# Patient Record
Sex: Female | Born: 2005 | Race: Black or African American | Hispanic: No | Marital: Single | Smoking: Never smoker
Health system: Southern US, Community
[De-identification: ages and names within clinical notes are randomized; demographics above are authoritative.]

## PROBLEM LIST (undated history)

## (undated) DIAGNOSIS — F319 Bipolar disorder, unspecified: Secondary | ICD-10-CM

## (undated) DIAGNOSIS — L309 Dermatitis, unspecified: Secondary | ICD-10-CM

## (undated) DIAGNOSIS — F909 Attention-deficit hyperactivity disorder, unspecified type: Secondary | ICD-10-CM

## (undated) DIAGNOSIS — F32A Depression, unspecified: Secondary | ICD-10-CM

## (undated) DIAGNOSIS — F329 Major depressive disorder, single episode, unspecified: Secondary | ICD-10-CM

## (undated) DIAGNOSIS — K219 Gastro-esophageal reflux disease without esophagitis: Secondary | ICD-10-CM

## (undated) HISTORY — DX: Depression, unspecified: F32.A

## (undated) HISTORY — DX: Dermatitis, unspecified: L30.9

## (undated) HISTORY — DX: Attention-deficit hyperactivity disorder, unspecified type: F90.9

## (undated) HISTORY — DX: Gastro-esophageal reflux disease without esophagitis: K21.9

## (undated) HISTORY — DX: Bipolar disorder, unspecified: F31.9

---
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
## 2023-06-03 ENCOUNTER — Ambulatory Visit: Payer: Self-pay | Admitting: Nurse Practitioner

## 2023-06-07 ENCOUNTER — Encounter: Payer: Self-pay | Admitting: Nurse Practitioner

## 2023-06-07 ENCOUNTER — Ambulatory Visit: Payer: MEDICAID | Admitting: Nurse Practitioner

## 2023-06-07 VITALS — BP 126/86 | HR 113 | Temp 97.5°F | Resp 18 | Ht 63.03 in | Wt 226.0 lb

## 2023-06-07 DIAGNOSIS — F320 Major depressive disorder, single episode, mild: Secondary | ICD-10-CM

## 2023-06-07 DIAGNOSIS — L906 Striae atrophicae: Secondary | ICD-10-CM

## 2023-06-07 NOTE — Assessment & Plan Note (Signed)
 PHQ9 positive,  declines medication, reports doing well without.

## 2023-06-07 NOTE — Progress Notes (Signed)
 BP 126/86   Pulse (!) 113   Temp (!) 97.5 F (36.4 C)   Resp 18   Ht 5' 3.03" (1.601 m)   Wt 226 lb (102.5 kg)   SpO2 92%   BMI 40.00 kg/m    Subjective:    Patient ID: Brandy Rich, female    DOB: [birth year], 18 y.o.   MRN: [id number]  HPI: Brandy Rich is a 18 y.o. female  Chief Complaint  Patient presents with   Eczema    Under breast    HPI Patient presents to clinic with complaints of bilateral scars under neath breasts. Patient reports that scars appeared a few months ago and has been applying eczema cream to areas, and has not seen any improvement. Patient denies any pain, burning, or itching.  Depression: PHQ-9 screen was positive during today's visit. Discussed options for counseling, psychiatry, and medication. Patient declined all options      06/07/2023    2:34 PM 09/16/2022    1:24 PM 07/31/2019    3:20 PM  Depression screen PHQ 2/9  Decreased Interest 1 0 0  Down, Depressed, Hopeless 0 0 1  PHQ - 2 Score 1 0 1  Altered sleeping 2 0 2  Tired, decreased energy 2 0 1  Change in appetite 1 0 2  Feeling bad or failure about yourself  0 0 2  Trouble concentrating 0 0 1  Moving slowly or fidgety/restless 0 0 2  Suicidal thoughts 0 0 0  PHQ-9 Score 6 0 11  Difficult doing work/chores Not difficult at all Not difficult at all Very difficult    Relevant past medical, surgical, family and social history reviewed and updated as indicated. Interim medical history since our last visit reviewed. Allergies and medications reviewed and updated.  Review of Systems Ten systems reviewed and is negative except as mentioned in HPI  Per HPI unless specifically indicated above     Objective:    BP 126/86   Pulse (!) 113   Temp (!) 97.5 F (36.4 C)   Resp 18   Ht 5' 3.03" (1.601 m)   Wt 226 lb (102.5 kg)   SpO2 92%   BMI 40.00 kg/m    Wt Readings from Last 3 Encounters:  06/07/23 226 lb (102.5 kg) (99%, Z= 2.27)*  09/16/22 211 lb 4.8 oz (95.8 kg)  (98%, Z= 2.11)*  07/31/19 196 lb 8 oz (89.1 kg) (98%, Z= 2.10)*   * Growth percentiles are based on CDC (Girls, 2-20 Years) data.    Physical Exam Vitals reviewed.  Constitutional:      Appearance: Normal appearance.  HENT:     Head: Normocephalic.  Cardiovascular:     Rate and Rhythm: Normal rate.  Pulmonary:     Effort: Pulmonary effort is normal.  Chest:  Breasts:    Right: Skin change present.     Left: Skin change present.       Comments: Patient has bilateral stretch marks underneath breasts, denies pain, itching or burning Neurological:     General: No focal deficit present.     Mental Status: She is alert and oriented to person, place, and time. Mental status is at baseline.  Psychiatric:        Mood and Affect: Mood normal.        Behavior: Behavior normal.        Thought Content: Thought content normal.        Judgment: Judgment normal.     Results for  orders placed or performed in visit on 09/16/22  CBC with Differential/Platelet   Collection Time: 09/16/22  1:59 PM  Result Value Ref Range   WBC 6.6 4.5 - 13.0 Thousand/uL   RBC 5.09 3.80 - 5.10 Million/uL   Hemoglobin 13.7 11.5 - 15.3 g/dL   HCT 53.6 64.4 - 03.4 %   MCV 82.1 78.0 - 98.0 fL   MCH 26.9 25.0 - 35.0 pg   MCHC 32.8 31.0 - 36.0 g/dL   RDW 74.2 59.5 - 63.8 %   Platelets 375 140 - 400 Thousand/uL   MPV 9.3 7.5 - 12.5 fL   Neutro Abs 3,828 1,800 - 8,000 cells/uL   Lymphs Abs 2,086 1,200 - 5,200 cells/uL   Absolute Monocytes 495 200 - 900 cells/uL   Eosinophils Absolute 172 15 - 500 cells/uL   Basophils Absolute 20 0 - 200 cells/uL   Neutrophils Relative % 58 %   Total Lymphocyte 31.6 %   Monocytes Relative 7.5 %   Eosinophils Relative 2.6 %   Basophils Relative 0.3 %  COMPLETE METABOLIC PANEL WITH GFR   Collection Time: 09/16/22  1:59 PM  Result Value Ref Range   Glucose, Bld 95 65 - 99 mg/dL   BUN 9 7 - 20 mg/dL   Creat 7.56 4.33 - 2.95 mg/dL   eGFR 188 > OR = 60 CZ/YSA/6.30Z6    BUN/Creatinine Ratio SEE NOTE: 6 - 22 (calc)   Sodium 140 135 - 146 mmol/L   Potassium 4.3 3.8 - 5.1 mmol/L   Chloride 106 98 - 110 mmol/L   CO2 25 20 - 32 mmol/L   Calcium 9.3 8.9 - 10.4 mg/dL   Total Protein 6.7 6.3 - 8.2 g/dL   Albumin 4.1 3.6 - 5.1 g/dL   Globulin 2.6 2.0 - 3.8 g/dL (calc)   AG Ratio 1.6 1.0 - 2.5 (calc)   Total Bilirubin 0.6 0.2 - 1.1 mg/dL   Alkaline phosphatase (APISO) 61 36 - 128 U/L   AST 10 (L) 12 - 32 U/L   ALT 7 5 - 32 U/L  Lipid panel   Collection Time: 09/16/22  1:59 PM  Result Value Ref Range   Cholesterol 183 (H) <170 mg/dL   HDL 39 (L) >01 mg/dL   Triglycerides 093 (H) <90 mg/dL   LDL Cholesterol (Calc) 118 (H) <110 mg/dL (calc)   Total CHOL/HDL Ratio 4.7 <5.0 (calc)   Non-HDL Cholesterol (Calc) 144 (H) <120 mg/dL (calc)  Hemoglobin A3F   Collection Time: 09/16/22  1:59 PM  Result Value Ref Range   Hgb A1c MFr Bld 5.7 (H) <5.7 % of total Hgb   Mean Plasma Glucose 117 mg/dL   eAG (mmol/L) 6.5 mmol/L  Hepatitis C antibody   Collection Time: 09/16/22  1:59 PM  Result Value Ref Range   Hepatitis C Ab NON-REACTIVE NON-REACTIVE  HIV Antibody (routine testing w rflx)   Collection Time: 09/16/22  1:59 PM  Result Value Ref Range   HIV 1&2 Ab, 4th Generation NON-REACTIVE NON-REACTIVE  TSH   Collection Time: 09/16/22  1:59 PM  Result Value Ref Range   TSH 0.90 mIU/L  FSH/LH   Collection Time: 09/16/22  1:59 PM  Result Value Ref Range   FSH 8.1 mIU/mL   LH 19.3 mIU/mL  Prolactin   Collection Time: 09/16/22  1:59 PM  Result Value Ref Range   Prolactin 8.9 ng/mL       Assessment & Plan:   Problem List Items Addressed This Visit  Other   Major depressive disorder   PHQ9 positive,  declines medication, reports doing well without.       Other Visit Diagnoses       Striae distensae    -  Primary   Patient has bilateral stretch marks underneath breast. Patient advised to keep areas dry and clean. Patient also advised to apply  Vitamin E to areas of concern       Patient has bilateral stretch marks underneath breast. Patient advised to keep areas dry and clean. Patient also advised to apply Vitamin E to areas of concern to minimize scaring.     Follow up plan: Return if symptoms worsen or fail to improve.

## 2023-09-17 ENCOUNTER — Encounter: Payer: Medicaid Other | Admitting: Nurse Practitioner

## 2023-09-30 ENCOUNTER — Encounter: Payer: MEDICAID | Admitting: Nurse Practitioner

## 2023-10-12 ENCOUNTER — Encounter: Payer: Self-pay | Admitting: Nurse Practitioner

## 2023-10-12 ENCOUNTER — Ambulatory Visit (INDEPENDENT_AMBULATORY_CARE_PROVIDER_SITE_OTHER): Payer: MEDICAID | Admitting: Nurse Practitioner

## 2023-10-12 VITALS — BP 124/82 | HR 93 | Temp 98.5°F | Resp 18 | Ht 63.0 in | Wt 223.9 lb

## 2023-10-12 DIAGNOSIS — R7303 Prediabetes: Secondary | ICD-10-CM | POA: Diagnosis not present

## 2023-10-12 DIAGNOSIS — E782 Mixed hyperlipidemia: Secondary | ICD-10-CM | POA: Diagnosis not present

## 2023-10-12 DIAGNOSIS — Z130 Encounter for screening for diseases of the blood and blood-forming organs and certain disorders involving the immune mechanism: Secondary | ICD-10-CM

## 2023-10-12 DIAGNOSIS — Z6839 Body mass index (BMI) 39.0-39.9, adult: Secondary | ICD-10-CM

## 2023-10-12 DIAGNOSIS — E66812 Obesity, class 2: Secondary | ICD-10-CM

## 2023-10-12 DIAGNOSIS — Z0000 Encounter for general adult medical examination without abnormal findings: Secondary | ICD-10-CM

## 2023-10-12 DIAGNOSIS — N912 Amenorrhea, unspecified: Secondary | ICD-10-CM | POA: Diagnosis not present

## 2023-10-12 NOTE — Progress Notes (Signed)
 Name: Brandy Rich   MRN: [id number]    DOB: [birth year]   Date:10/12/2023       Progress Note  Subjective  Chief Complaint  Chief Complaint  Patient presents with   Annual Exam   Amenorrhea    For about 1 year    HPI  Discussed the use of AI scribe software for clinical note transcription with the patient, who gave verbal consent to proceed.  History of Present Illness Brandy Rich is a 18 year old female who presents with amenorrhea for an annual physical exam.  She has not had a menstrual period for the past year. Previously, her periods were irregular, but this is the first time she has gone a full year without menstruation. She occasionally experiences random cramping without the onset of a period. Menarche occurred at age 83 with initially regular cycles. She has never seen a gynecologist before.  She has a history of prediabetes and hyperlipidemia, with no current symptoms related to these conditions. She has a history of obesity and is attempting to incorporate exercise into her routine, although she finds it challenging due to the heat. She sometimes tries to do sit-ups and is working on making exercise a habit.  She reports getting 7 to 8 hours of sleep each night. She is not currently sexually active. No issues with incontinence or problems going to the bathroom.  Family history is significant for adenomyosis, as her mother had a hysterectomy due to this condition.   Patient presents for annual CPE.  Diet: eats whatever she wants Exercise: just dance, walks in place  Sleep: 7-8 hours Last dental exam:last year Last eye exam: been awhile, will schedule  Flowsheet Row Office Visit from 10/12/2023 in [facility]  AUDIT-C Score 0   Depression: Phq 9 is  negative    10/12/2023   10:00 AM 06/07/2023    2:34 PM 09/16/2022    1:24 PM 07/31/2019    3:20 PM 06/28/2019   10:49 AM  Depression screen PHQ 2/9  Decreased Interest 0 1 0 0 0  Down,  Depressed, Hopeless 0 0 0 1 1  PHQ - 2 Score 0 1 0 1 1  Altered sleeping 0 2 0 2   Tired, decreased energy 0 2 0 1 3  Change in appetite 0 1 0 2 3  Feeling bad or failure about yourself  0 0 0 2 3  Trouble concentrating 0 0 0 1 1  Moving slowly or fidgety/restless 0 0 0 2 0  Suicidal thoughts 0 0 0 0 0  PHQ-9 Score 0 6 0 11   Difficult doing work/chores Not difficult at all Not difficult at all Not difficult at all Very difficult Extremely dIfficult   Hypertension: BP Readings from Last 3 Encounters:  10/12/23 124/82  06/07/23 126/86  09/16/22 110/78   Obesity: Wt Readings from Last 3 Encounters:  10/12/23 223 lb 14.4 oz (101.6 kg) (99%, Z= 2.25)*  06/07/23 226 lb (102.5 kg) (99%, Z= 2.27)*  09/16/22 211 lb 4.8 oz (95.8 kg) (98%, Z= 2.11)*   * Growth percentiles are based on CDC (Girls, 2-20 Years) data.   BMI Readings from Last 3 Encounters:  10/12/23 39.66 kg/m (99%, Z= 2.20, 126% of 95%ile)*  06/07/23 40.00 kg/m (99%, Z= 2.26, 128% of 95%ile)*  09/16/22 37.43 kg/m (98%, Z= 2.11, 122% of 95%ile)*   * Growth percentiles are based on CDC (Girls, 2-20 Years) data.     Vaccines:  HPV: up  to at age 78 , ask insurance if age between 77-45  Shingrix: 93-64 yo and ask insurance if covered when patient above 10 yo Pneumonia:  educated and discussed with patient. Flu:  educated and discussed with patient.  Hep C Screening: completed STD testing and prevention (HIV/chl/gon/syphilis): completed Intimate partner violence:none Sexual History : not sexually active Menstrual History/LMP/Abnormal Bleeding: [facility]: a year ago Incontinence Symptoms: none  Breast cancer:  - Last Mammogram: does not qualify - BRCA gene screening: none  Osteoporosis: Discussed high calcium and vitamin D supplementation, weight bearing exercises  Cervical cancer screening: does not qualify  Skin cancer: Discussed monitoring for atypical lesions  Colorectal cancer: does not qualify   Lung cancer:    Low Dose CT Chest recommended if Age 75-80 years, 20 pack-year currently smoking OR have quit w/in 15years. Patient does not qualify.   ECG: none  Advanced Care Planning: A voluntary discussion about advance care planning including the explanation and discussion of advance directives.  Discussed health care proxy and Living will, and the patient was able to identify a health care proxy as mom.  Patient does not have a living will at present time. If patient does have living will, I have requested they bring this to the clinic to be scanned in to their chart.  Lipids: Lab Results  Component Value Date   CHOL 183 (H) 09/16/2022   CHOL 189 (H) 07/31/2019   Lab Results  Component Value Date   HDL 39 (L) 09/16/2022   HDL 40 (L) 07/31/2019   Lab Results  Component Value Date   LDLCALC 118 (H) 09/16/2022   LDLCALC 127 (H) 07/31/2019   Lab Results  Component Value Date   TRIG 151 (H) 09/16/2022   TRIG 112 (H) 07/31/2019   Lab Results  Component Value Date   CHOLHDL 4.7 09/16/2022   CHOLHDL 4.7 07/31/2019   No results found for: LDLDIRECT  Glucose: Glucose, Bld  Date Value Ref Range Status  09/16/2022 95 65 - 99 mg/dL Final    Comment:    .            Fasting reference interval .   07/31/2019 96 65 - 99 mg/dL Final    Comment:    .            Fasting reference interval .     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 No pertinent surgical  history.  Family History  Problem Relation Age of Onset   Depression Mother    Bipolar disorder Mother    Post-traumatic stress disorder Mother    Ulcerative colitis Mother    ADD / ADHD Mother    Depression Father    ADD / ADHD Father    Hypertension Maternal Grandmother    Diabetes Maternal Grandmother        non insulin   Heart disease Maternal Grandmother    Hypertension Maternal Grandfather     Social History   Socioeconomic History   Marital status: Single  Spouse name: Not on file   Number of children: Not on file   Years of education: Not on file   Highest education level: Not on file  Occupational History   Not on file  Tobacco Use   Smoking status: Never   Smokeless tobacco: Never  Vaping Use   Vaping status: Never Used  Substance and Sexual Activity   Alcohol use: Never   Drug use: Not Currently   Sexual activity: Never  Other Topics Concern   Not on file  Social History Narrative   Graduated High School in 2024 rising Freshmen at [employer]    Social Drivers of Health   Financial Resource Strain: Low Risk  (10/12/2023)   Overall Financial Resource Strain (CARDIA)    Difficulty of Paying Living Expenses: Not hard at all  Food Insecurity: No Food Insecurity (10/12/2023)   Hunger Vital Sign    Worried About Running Out of Food in the Last Year: Never true    Ran Out of Food in the Last Year: Never true  Transportation Needs: No Transportation Needs (10/12/2023)   PRAPARE - Administrator, Civil Service (Medical): No    Lack of Transportation (Non-Medical): No  Physical Activity: Insufficiently Active (10/12/2023)   Exercise Vital Sign    Days of Exercise per Week: 2 days    Minutes of Exercise per Session: 30 min  Stress: No Stress Concern Present (10/12/2023)   [employer] of Occupational Health - Occupational Stress Questionnaire    Feeling of Stress: Not at all  Social Connections: Socially Isolated (10/12/2023)   Social Connection and  Isolation Panel    Frequency of Communication with Friends and Family: Never    Frequency of Social Gatherings with Friends and Family: Never    Attends Religious Services: Never    Database administrator or Organizations: No    Attends Banker Meetings: Never    Marital Status: Never married  Intimate Partner Violence: Not At Risk (10/12/2023)   Humiliation, Afraid, Rape, and Kick questionnaire    Fear of Current or Ex-Partner: No    Emotionally Abused: No    Physically Abused: No    Sexually Abused: No     Current Outpatient Medications:    docusate sodium  (COLACE) 100 MG capsule, Take 1 capsule (100 mg total) by mouth every 12 (twelve) hours. (Patient not taking: Reported on 10/12/2023), Disp: 60 capsule, Rfl: 1   omeprazole  (PRILOSEC) 20 MG capsule, Take 1 capsule (20 mg total) by mouth daily before breakfast. (Patient not taking: Reported on 10/12/2023), Disp: 90 capsule, Rfl: 3   senna (SENOKOT) 8.6 MG tablet, Take 1 tablet (8.6 mg total) by mouth daily. (Patient not taking: Reported on 10/12/2023), Disp: 30 tablet, Rfl: 1   triamcinolone  ointment (KENALOG ) 0.5 %, Apply 1 Application topically 2 (two) times daily as needed (for itching eczema flares). (Patient not taking: Reported on 10/12/2023), Disp: 30 g, Rfl: 0  No Known Allergies   ROS  Constitutional: Negative for fever or weight change.  Respiratory: Negative for cough and shortness of breath.   Cardiovascular: Negative for chest pain or palpitations.  Gastrointestinal: Negative for abdominal pain, no bowel changes.  Musculoskeletal: Negative for gait problem or joint swelling.  Skin: Negative for rash.  Neurological: Negative for dizziness or headache.  No other specific complaints in a complete review of systems (except as listed in HPI above).   Objective  Vitals:   10/12/23 0957  BP: 124/82  Pulse: 93  Resp: 18  Temp: 98.5 F (36.9 C)  SpO2: 99%  Weight: 223 lb 14.4 oz (101.6 kg)  Height: 5' 3 (1.6  m)    Body mass index is 39.66 kg/m.  Physical Exam Physical Exam MEASUREMENTS: Weight- 223, BMI- 39.66. GENERAL: Alert, cooperative, well developed, no acute distress HEENT: Normocephalic, normal oropharynx, moist mucous membranes, eyes normal CHEST: Clear to auscultation bilaterally, no wheezes, rhonchi, or crackles CARDIOVASCULAR: Normal heart rate and rhythm, S1 and S2 normal without murmurs ABDOMEN: Soft, non-tender, non-distended, without organomegaly, normal bowel sounds EXTREMITIES: No cyanosis or edema NEUROLOGICAL: Cranial nerves grossly intact, moves all extremities without gross motor or sensory deficit, motor function normal, sensation intact bilaterally, gait normal    No results found for this or any previous visit (from the past 2160 hours).   Fall Risk:    10/12/2023    9:59 AM 06/07/2023    2:16 PM 09/16/2022    1:24 PM 07/31/2019    3:20 PM 06/28/2019   10:48 AM  Fall Risk   Falls in the past year? 0 0 0 0  0   Number falls in past yr: 0 0 0 0 0  Injury with Fall? 0 0 0 0 0  Follow up Falls evaluation completed Falls evaluation completed        Data saved with a previous flowsheet row definition     Functional Status Survey: Is the patient deaf or have difficulty hearing?: No Does the patient have difficulty seeing, even when wearing glasses/contacts?: No Does the patient have difficulty concentrating, remembering, or making decisions?: No Does the patient have difficulty walking or climbing stairs?: No Does the patient have difficulty dressing or bathing?: No Does the patient have difficulty doing errands alone such as visiting a doctor's office or shopping?: No   Assessment & Plan  Problem List Items Addressed This Visit       Other   Prediabetes   Relevant Orders   Comprehensive metabolic panel with GFR   Hemoglobin A1c   Mixed hyperlipidemia   Relevant Orders   Lipid panel   Class 2 severe obesity due to excess calories with serious  comorbidity and body mass index (BMI) of 39.0 to 39.9 in adult [facility])   Other Visit Diagnoses       Annual physical exam    -  Primary   Relevant Orders   CBC with Differential/Platelet   Comprehensive metabolic panel with GFR   Lipid panel   Hemoglobin A1c     Screening for deficiency anemia       Relevant Orders   CBC with Differential/Platelet     Amenorrhea       Relevant Orders   Testosterone   FSH/LH   Prolactin   17-Hydroxyprogesterone   Estrogens, total   TSH   DHEA   Ambulatory referral to Gynecology   POCT urine pregnancy      Assessment and Plan Assessment & Plan Amenorrhea Amenorrhea for one year with a history of irregular periods. Possible differential diagnosis includes polycystic ovarian syndrome (PCOS). - Order PCOS labs and urine pregnancy test - Refer to GYN for further evaluation - Discuss potential treatment options such as birth control to regulate hormones and cycles - Emphasize the importance of determining the underlying cause before treatment -patient unable to provide urine sample, will add on beta qual  Obesity Obesity with a current weight of 223 pounds and a BMI of 39.66. Challenges with diet and exercise were discussed. - Encourage regular  exercise and balanced diet  Prediabetes Prediabetes without specific symptoms discussed during the visit. - Retake labs for prediabetes assessment  Hyperlipidemia Hyperlipidemia without specific symptoms discussed during the visit. - Retake labs for hyperlipidemia assessment  Adult Wellness Visit Routine adult wellness visit. Blood pressure is within normal limits. Reports difficulty maintaining a balanced diet and regular exercise routine, especially in hot weather. Sleep is adequate with 7-8 hours per night. No issues with incontinence or violence at home. Not currently sexually active. Last dental and eye exams were a while ago. - Encourage balanced diet and regular exercise    -USPSTF grade A  and B recommendations reviewed with patient; age-appropriate recommendations, preventive care, screening tests, etc discussed and encouraged; healthy living encouraged; see AVS for patient education given to patient -Discussed importance of 150 minutes of physical activity weekly, eat two servings of fish weekly, eat one serving of tree nuts ( cashews, pistachios, pecans, almonds.SABRA) every other day, eat 6 servings of fruit/vegetables daily and drink plenty of water and avoid sweet beverages.   -Reviewed Health Maintenance: yes

## 2023-10-15 ENCOUNTER — Ambulatory Visit: Payer: Self-pay | Admitting: Nurse Practitioner

## 2023-10-18 LAB — COMPREHENSIVE METABOLIC PANEL WITH GFR
AG Ratio: 1.4 (calc) (ref 1.0–2.5)
ALT: 12 U/L (ref 5–32)
AST: 14 U/L (ref 12–32)
Albumin: 4.4 g/dL (ref 3.6–5.1)
Alkaline phosphatase (APISO): 72 U/L (ref 36–128)
BUN/Creatinine Ratio: 9 (calc) (ref 6–22)
BUN: 6 mg/dL — ABNORMAL LOW (ref 7–20)
CO2: 26 mmol/L (ref 20–32)
Calcium: 9.6 mg/dL (ref 8.9–10.4)
Chloride: 103 mmol/L (ref 98–110)
Creat: 0.68 mg/dL (ref 0.50–0.96)
Globulin: 3.1 g/dL (ref 2.0–3.8)
Glucose, Bld: 92 mg/dL (ref 65–99)
Potassium: 4.4 mmol/L (ref 3.8–5.1)
Sodium: 137 mmol/L (ref 135–146)
Total Bilirubin: 0.9 mg/dL (ref 0.2–1.1)
Total Protein: 7.5 g/dL (ref 6.3–8.2)
eGFR: 129 mL/min/1.73m2 (ref >=60)

## 2023-10-18 LAB — CBC WITH DIFFERENTIAL/PLATELET
Absolute Lymphocytes: 2008 {cells}/uL (ref 850–3900)
Absolute Monocytes: 469 {cells}/uL (ref 200–950)
Basophils Absolute: 28 {cells}/uL (ref 0–200)
Basophils Relative: 0.4 %
Eosinophils Absolute: 152 {cells}/uL (ref 15–500)
Eosinophils Relative: 2.2 %
HCT: 44.4 % (ref 35.0–45.0)
Hemoglobin: 14.3 g/dL (ref 11.7–15.5)
MCH: 26.9 pg — ABNORMAL LOW (ref 27.0–33.0)
MCHC: 32.2 g/dL (ref 32.0–36.0)
MCV: 83.6 fL (ref 80.0–100.0)
MPV: 8.7 fL (ref 7.5–12.5)
Monocytes Relative: 6.8 %
Neutro Abs: 4244 {cells}/uL (ref 1500–7800)
Neutrophils Relative %: 61.5 %
Platelets: 389 Thousand/uL (ref 140–400)
RBC: 5.31 Million/uL — ABNORMAL HIGH (ref 3.80–5.10)
RDW: 13.8 % (ref 11.0–15.0)
Total Lymphocyte: 29.1 %
WBC: 6.9 Thousand/uL (ref 3.8–10.8)

## 2023-10-18 LAB — TESTOSTERONE, TOTAL, LC/MS/MS: Testosterone, Total, LC-MS-MS: 51 ng/dL — ABNORMAL HIGH (ref 2–45)

## 2023-10-18 LAB — PROLACTIN: Prolactin: 9.7 ng/mL

## 2023-10-18 LAB — LIPID PANEL
Cholesterol: 197 mg/dL — ABNORMAL HIGH (ref <170)
HDL: 37 mg/dL — ABNORMAL LOW (ref >45)
LDL Cholesterol (Calc): 135 mg/dL — ABNORMAL HIGH (ref <110)
Non-HDL Cholesterol (Calc): 160 mg/dL — ABNORMAL HIGH (ref <120)
Total CHOL/HDL Ratio: 5.3 (calc) — ABNORMAL HIGH (ref <5.0)
Triglycerides: 130 mg/dL — ABNORMAL HIGH (ref <90)

## 2023-10-18 LAB — HEMOGLOBIN A1C
Hgb A1c MFr Bld: 5.8 % — ABNORMAL HIGH (ref <5.7)
Mean Plasma Glucose: 120 mg/dL
eAG (mmol/L): 6.6 mmol/L

## 2023-10-18 LAB — DHEA: DHEA: 1400 ng/dL

## 2023-10-18 LAB — ESTROGENS, TOTAL: Estrogen: 173 pg/mL

## 2023-10-18 LAB — TSH: TSH: 1.81 m[IU]/L

## 2023-10-18 LAB — FSH/LH
FSH: 6.9 m[IU]/mL
LH: 11.9 m[IU]/mL

## 2023-10-18 LAB — HCG, SERUM, QUALITATIVE: Preg, Serum: NEGATIVE

## 2023-10-18 LAB — 17-HYDROXYPROGESTERONE: 17-OH-Progesterone, LC/MS/MS: 72 ng/dL

## 2023-11-29 NOTE — Progress Notes (Unsigned)
    GYNECOLOGY PROGRESS NOTE  Subjective:    Patient ID: Johnasia Liese, female    DOB: 03-17-2006, 18 y.o.   MRN: [id number]  HPI  Patient is a 18 y.o. No obstetric history on file. female who presents for evaluation for Amenorrhea.  The following portions of the patient's history were reviewed and updated as appropriate: allergies, current medications, past family history, past medical history, past social history, past surgical history, and problem list.  Review of Systems Pertinent items noted in HPI and remainder of comprehensive ROS otherwise negative.   Objective:   There were no vitals taken for this visit. There is no height or weight on file to calculate BMI. General appearance: alert, cooperative, and no distress Abdomen: {abdominal exam:16834} Pelvic: {pelvic exam:16852::cervix normal in appearance,external genitalia normal,no adnexal masses or tenderness,no cervical motion tenderness,rectovaginal septum normal,uterus normal size, shape, and consistency,vagina normal without discharge} Extremities: {extremity exam:5109} Neurologic: {neuro exam:17854}   Assessment:   No diagnosis found.   Plan:   There are no diagnoses linked to this encounter.    Damien Parsley, CNM [facility]

## 2023-11-30 ENCOUNTER — Ambulatory Visit (INDEPENDENT_AMBULATORY_CARE_PROVIDER_SITE_OTHER): Payer: MEDICAID | Admitting: Certified Nurse Midwife

## 2023-11-30 ENCOUNTER — Encounter: Payer: Self-pay | Admitting: Certified Nurse Midwife

## 2023-11-30 VITALS — BP 114/79 | HR 83 | Resp 16 | Ht 63.0 in | Wt 226.6 lb

## 2023-11-30 DIAGNOSIS — N912 Amenorrhea, unspecified: Secondary | ICD-10-CM

## 2023-12-04 LAB — TESTOSTERONE, FREE, TOTAL, SHBG
Sex Hormone Binding: 14.7 nmol/L — ABNORMAL LOW (ref 24.6–122.0)
Testosterone, Free: 1.9 pg/mL
Testosterone: 52 ng/dL (ref 13–71)

## 2023-12-05 LAB — ANTI MULLERIAN HORMONE: ANTI-MULLERIAN HORMONE (AMH): 13.8 ng/mL — ABNORMAL HIGH

## 2023-12-07 ENCOUNTER — Ambulatory Visit: Payer: Self-pay | Admitting: Certified Nurse Midwife

## 2023-12-07 ENCOUNTER — Encounter: Payer: Self-pay | Admitting: Certified Nurse Midwife

## 2023-12-07 DIAGNOSIS — E282 Polycystic ovarian syndrome: Secondary | ICD-10-CM | POA: Insufficient documentation

## 2024-01-04 ENCOUNTER — Encounter: Payer: Self-pay | Admitting: Physician Assistant

## 2024-01-04 ENCOUNTER — Telehealth: Payer: MEDICAID | Admitting: Physician Assistant

## 2024-01-04 DIAGNOSIS — K219 Gastro-esophageal reflux disease without esophagitis: Secondary | ICD-10-CM | POA: Diagnosis not present

## 2024-01-04 MED ORDER — FAMOTIDINE 20 MG PO TABS: 20.0000 mg | ORAL_TABLET | Freq: Two times a day (BID) | ORAL | 0 refills | Status: AC

## 2024-01-04 MED ORDER — OMEPRAZOLE 20 MG PO CPDR: 20.0000 mg | DELAYED_RELEASE_CAPSULE | Freq: Every day | ORAL | 0 refills | Status: AC

## 2024-01-04 NOTE — Patient Instructions (Signed)
 Brandy Rich, thank you for joining Brandy Velma Lunger, PA-C for today's virtual visit.  While this provider is not your primary care provider (PCP), if your PCP is located in our provider database this encounter information will be shared with them immediately following your visit.   A [facility] MyChart account gives you access to today's visit and all your visits, tests, and labs performed at [facility]  click here if you don't have a [facility] MyChart account or go to [email or web address]  Consent: (Patient) Brandy Rich provided verbal consent for this virtual visit at the beginning of the encounter.  Current Medications:  Current Outpatient Medications:    docusate sodium  (COLACE) 100 MG capsule, Take 1 capsule (100 mg total) by mouth every 12 (twelve) hours. (Patient not taking: Reported on 10/12/2023), Disp: 60 capsule, Rfl: 1   omeprazole  (PRILOSEC) 20 MG capsule, Take 1 capsule (20 mg total) by mouth daily before breakfast., Disp: 90 capsule, Rfl: 3   triamcinolone  ointment (KENALOG ) 0.5 %, Apply 1 Application topically 2 (two) times daily as needed (for itching eczema flares)., Disp: 30 g, Rfl: 0   Medications ordered in this encounter:  No orders of the defined types were placed in this encounter.    *If you need refills on other medications prior to your next appointment, please contact your pharmacy*  Follow-Up: Call back or seek an in-person evaluation if the symptoms worsen or if the condition fails to improve as anticipated.  [facility] [phone]  Other Instructions Switch to sips of water. Limit any tea or soda. Please follow dietary recommendations below. Restart Omeprazole  once daily. Take the Famotidine twice daily over the next 3 days while the Omeprazole  is getting back in your sytem. If you note any non-resolving, new, or worsening symptoms despite treatment, please seek an in-person evaluation ASAP.   GERD in  Adults: Diet Changes When you have gastroesophageal reflux disease (GERD), you may need to make changes to your diet. Choosing the right foods can help with your symptoms. Think about working with an expert in healthy eating called a dietitian. They can help you make healthy food choices. What are tips for following this plan? Reading food labels Look for foods that are low in saturated fat. Foods that may help with your symptoms include: Foods with less than 5% of daily value (DV) of fat. Foods with 0 grams of trans fat. Cooking [employer] in ways that don't use a lot of fat. These ways include: Baking. Steaming. Grilling. Broiling. To add flavor, try to use herbs that are low in spice and acidity. Avoid frying your food. Meal planning  Eat small meals often rather than eating 3 large meals each day. Eat your meals slowly in a place where you feel relaxed. If told by your health care provider, avoid: Foods that cause symptoms. Keep a food diary to keep track of foods that cause symptoms. Alcohol. Drinking a lot of liquid with meals. General instructions For 2-3 hours after you eat, avoid: Bending over. Exercise. Lying down. Chew sugar-free gum after meals. What foods should I eat? Eat a healthy diet. Try to include: Foods with high amounts of fiber. These include: Fruits and vegetables. Whole grains and beans. Low-fat dairy products. Lean meats, fish, and poultry. Egg whites. Foods that cause symptoms in someone else may not cause symptoms for you. Work with your provider to find foods that are safe for you. The items listed above may not be all  the foods and drinks you can have. Talk with a dietitian to learn more. The items listed above may not be a complete list of foods and beverages you can eat and drink. Contact a dietitian for more information. What foods should I avoid? Limiting some of these foods may help with your symptoms. Each person is different. Talk with  a dietitian or your provider to help you find the exact foods to avoid. Some of the foods to avoid may include: Fruits Fruits with a lot of acid in them. These may include citrus fruits, such as oranges, grapefruit, pineapple, and lemons. Vegetables Deep-fried vegetables, such as [place] fries. Vegetables, sauces, or toppings made with added fat and vegetables with acid in them. These may include tomatoes and tomato products, chili peppers, onions, garlic, and horseradish. Grains Pastries or quick breads with added fat. Meats and other proteins High-fat meats, such as fatty beef or pork, hot dogs, ribs, ham, sausage, salami, and bacon. Fried meat or protein, such as fried fish and fried chicken. Egg yolks. Fats and oils Butter. Margarine. Shortening. Ghee. Drinks Coffee and other drinks with caffeine in them. Fizzy and sugary drinks, such as soda and energy drinks. Fruit juice made with acidic fruits, such as orange or grapefruit. Tomato juice. Sweets and desserts Chocolate and cocoa. Donuts. Seasonings and condiments Mint, such as peppermint and spearmint. Condiments, herbs, or seasonings that cause symptoms. These may include curry, hot sauce, or vinegar-based salad dressings. The items listed above may not be all the foods and drinks you should avoid. Talk with a dietitian to learn more. Questions to ask your health care provider Changes to your diet and everyday life are often the first steps taken to manage symptoms of GERD. If these changes don't help, talk with your provider about taking medicines. Where to find more information [employer] for Gastrointestinal Disorders: aboutgerd.org This information is not intended to replace advice given to you by your health care provider. Make sure you discuss any questions you have with your health care provider. Document Revised: 02/02/2023 Document Reviewed: 08/19/2022 Elsevier Patient Education  2024 Elsevier Inc.   If you  have been instructed to have an in-person evaluation today at a local Urgent Care facility, please use the link below. It will take you to a list of all of our available [facility], including address, phone number and hours of operation. Please do not delay care.  [facility]  If you or a family member do not have a primary care provider, use the link below to schedule a visit and establish care. When you choose a [facility] primary care physician or advanced practice provider, you gain a long-term partner in health. Find a Primary Care Provider  Learn more about [facility] in-office and virtual care options: [facility] [place] - Get Care Now

## 2024-01-04 NOTE — Progress Notes (Signed)
 Virtual Visit Consent   [name], you are scheduled for a virtual visit with a  provider today. Just as with appointments in the office, your consent must be obtained to participate. Your consent will be active for this visit and any virtual visit you may have with one of our providers in the next 365 days. If you have a MyChart account, a copy of this consent can be sent to you electronically.  As this is a virtual visit, video technology does not allow for your provider to perform a traditional examination. This may limit your provider's ability to fully assess your condition. If your provider identifies any concerns that need to be evaluated in person or the need to arrange testing (such as labs, EKG, etc.), we will make arrangements to do so. Although advances in technology are sophisticated, we cannot ensure that it will always work on either your end or our end. If the connection with a video visit is poor, the visit may have to be switched to a telephone visit. With either a video or telephone visit, we are not always able to ensure that we have a secure connection.  By engaging in this virtual visit, you consent to the provision of healthcare and authorize for your insurance to be billed (if applicable) for the services provided during this visit. Depending on your insurance coverage, you may receive a charge related to this service.  I need to obtain your verbal consent now. Are you willing to proceed with your visit today? [name] has provided verbal consent on 01/04/2024 for a virtual visit (video or telephone). Brandy Rich, [place]  Date: 01/04/2024 10:04 AM   Virtual Visit via Video Note   I, Brandy Rich, connected with  Alexzandra Bilton  ([id number], 2006/03/14) on 01/04/24 at  9:45 AM EDT by a video-enabled telemedicine application and verified that I am speaking with the correct person using two identifiers.  Location: Patient: Virtual Visit  Location Patient: Home Provider: Virtual Visit Location Provider: Home Office   I discussed the limitations of evaluation and management by telemedicine and the availability of in person appointments. The patient expressed understanding and agreed to proceed.    History of Present Illness: Brandy Rich is a 18 y.o. who identifies as a female who was assigned female at birth, and is being seen today for increased heartburn and reflux symptoms over the past week. Notes last Thursday had an episode of emesis due to heartburn. None since. Denies melena, hematochezia or tenesmus. Denies belly pain, fever or chills. Has history of GERD treated with Prilosec 20 mg daily but has been out of medicine for some time. Also notes drinking more tea/lemonade recently and having to eat more fast food than usual. Denies change to activity or stressors. Denies alcohol use, tobacco/vape use, or recent NSAID use.   HPI: HPI  Problems:  Patient Active Problem List   Diagnosis Date Noted   PCOS (polycystic ovarian syndrome) 12/07/2023   Class 2 severe obesity due to excess calories with serious comorbidity and body mass index (BMI) of 39.0 to 39.9 in adult 10/12/2023   Flexural eczema 09/16/2022   Chronic idiopathic constipation 09/16/2022   Prediabetes 08/02/2019   Mixed hyperlipidemia 08/02/2019   Attention deficit hyperactivity disorder (ADHD), predominantly inattentive type 07/20/2019   Affective psychosis, bipolar (HCC) 06/22/2019   Gastroesophageal reflux disease 06/06/2019   Major depressive disorder 12/14/2017    Allergies: No Known Allergies Medications:  Current Outpatient Medications:  famotidine (PEPCID) 20 MG tablet, Take 1 tablet (20 mg total) by mouth 2 (two) times daily., Disp: 14 tablet, Rfl: 0   omeprazole  (PRILOSEC) 20 MG capsule, Take 1 capsule (20 mg total) by mouth daily., Disp: 30 capsule, Rfl: 0   docusate sodium  (COLACE) 100 MG capsule, Take 1 capsule (100 mg total) by mouth every  12 (twelve) hours. (Patient not taking: Reported on 10/12/2023), Disp: 60 capsule, Rfl: 1   triamcinolone  ointment (KENALOG ) 0.5 %, Apply 1 Application topically 2 (two) times daily as needed (for itching eczema flares)., Disp: 30 g, Rfl: 0  Observations/Objective: Patient is well-developed, well-nourished in no acute distress.  Resting comfortably  at home.  Head is normocephalic, atraumatic.  No labored breathing.  Speech is clear and coherent with logical content.  Patient is alert and oriented at baseline.   Assessment and Plan: 1. Gastroesophageal reflux disease without esophagitis (Primary) - famotidine (PEPCID) 20 MG tablet; Take 1 tablet (20 mg total) by mouth 2 (two) times daily.  Dispense: 14 tablet; Refill: 0 - omeprazole  (PRILOSEC) 20 MG capsule; Take 1 capsule (20 mg total) by mouth daily.  Dispense: 30 capsule; Refill: 0  Supportive measures and OTC medications reviewed. Discussed GERD diet and handout placed in AVS to MyChart. Omeprazole  refilled to resume. Will also have her take 20 mg Famotidine BID for the next couple of days until PPI builds back up in her system. In person evaluation ASAP for any non-resolving, new or worsening symptoms despite treatment. School note provided.   Follow Up Instructions: I discussed the assessment and treatment plan with the patient. The patient was provided an opportunity to ask questions and all were answered. The patient agreed with the plan and demonstrated an understanding of the instructions.  A copy of instructions were sent to the patient via MyChart unless otherwise noted below.   The patient was advised to call back or seek an in-person evaluation if the symptoms worsen or if the condition fails to improve as anticipated.    Brandy Velma Lunger, PA-C

## 2024-05-02 ENCOUNTER — Ambulatory Visit: Payer: MEDICAID | Admitting: Nurse Practitioner

## 2024-10-12 ENCOUNTER — Encounter: Payer: MEDICAID | Admitting: Nurse Practitioner
